# Patient Record
Sex: Female | Born: 1995 | Hispanic: No | Marital: Single | Smoking: Never smoker
Health system: Southern US, Community
[De-identification: ages and names within clinical notes are randomized; demographics above are authoritative.]

---
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
## 2016-11-29 IMAGING — CR DG CHEST 1V
1 series · 1 of 1 positions shown · non-contrast
Comparison: None.

CLINICAL DATA: Positive QuantiFERON Gold.  No symptoms.

EXAM:
CHEST  1 VIEW

[w chest pa]
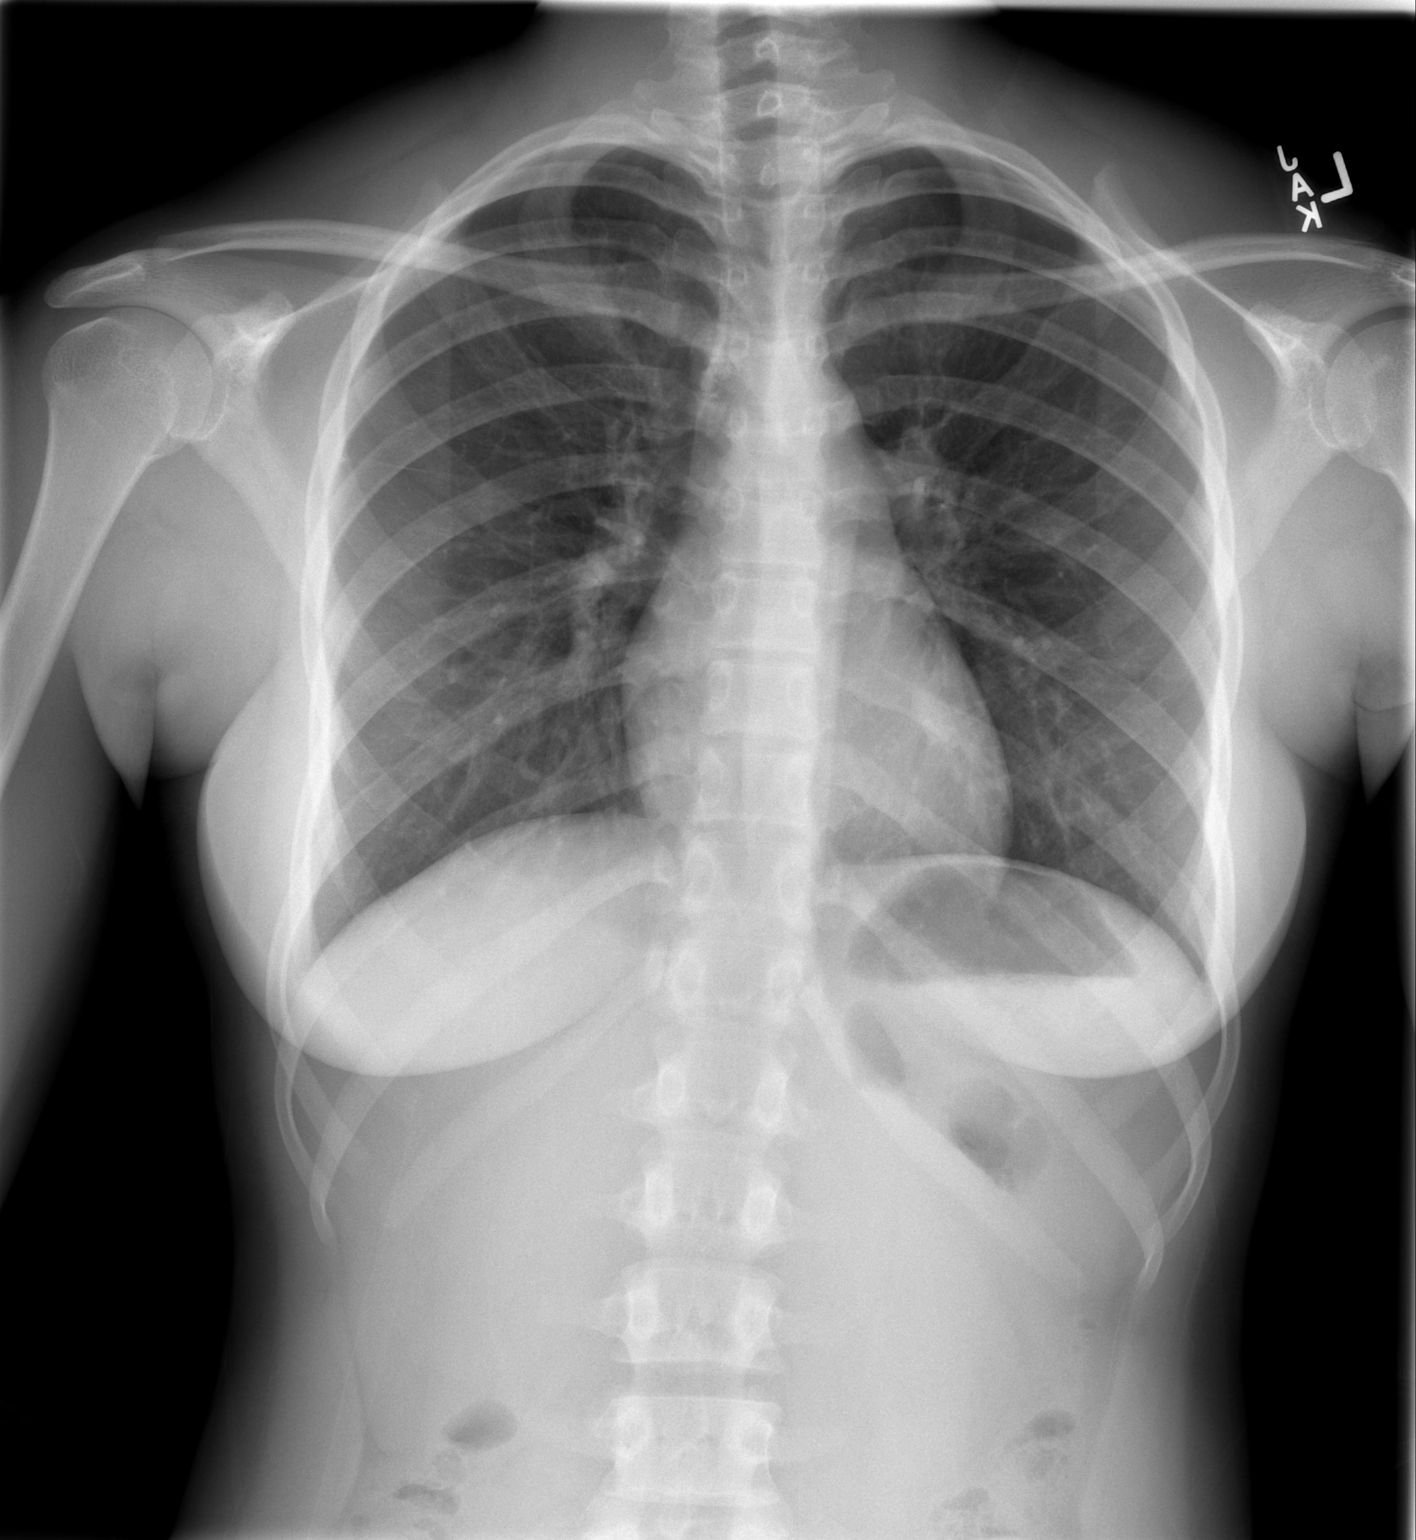

[1 of 1 positions shown; findings below may reference images not displayed]

FINDINGS: Normal heart size and mediastinal contours. No acute infiltrate or
edema. No scarring or calcified nodule. No effusion or pneumothorax.
No acute osseous findings.
IMPRESSION: Normal chest.

## 2018-03-22 ENCOUNTER — Ambulatory Visit (HOSPITAL_COMMUNITY)
Admission: EM | Admit: 2018-03-22 | Discharge: 2018-03-22 | Disposition: A | Discharge status: Home / Self Care | Payer: Self-pay | Attending: Family Medicine | Admitting: Family Medicine

## 2018-03-22 ENCOUNTER — Encounter (HOSPITAL_COMMUNITY): Payer: Self-pay | Admitting: Emergency Medicine

## 2018-03-22 DIAGNOSIS — L309 Dermatitis, unspecified: Secondary | ICD-10-CM

## 2018-03-22 MED ORDER — PREDNISONE 50 MG PO TABS: 50.0000 mg | ORAL_TABLET | Freq: Every day | ORAL | 0 refills | Status: DC

## 2018-03-22 MED ORDER — TRIAMCINOLONE ACETONIDE 0.1 % EX CREA: 1.0000 "application " | TOPICAL_CREAM | Freq: Two times a day (BID) | CUTANEOUS | 0 refills | Status: DC

## 2018-03-22 NOTE — ED Triage Notes (Signed)
Pt c/o rash on her R wrist x2 weeks, c/o itchiness.

## 2018-03-22 NOTE — Discharge Instructions (Signed)
Start prednisone as directed. Triamcinolone cream on affected area as directed. Avoid scratching, you can take over the counter allergy medicine to help. Avoid hot water.

## 2018-03-22 NOTE — ED Provider Notes (Signed)
[facility]    CSN: [id number] Arrival date & time: 03/22/18  1012     History   Chief Complaint Chief Complaint  Patient presents with  . Rash    HPI Becky Pugh is a 23 y.o. female.   23 year old female comes in for 2 week history of itching rash to the right wrist/hand and left elbow. States rash has been spreading gradually. Denies pain. Denies spreading erythema, warmth, fever. No obvious new contact/exposures. She does state she needs to wear thick gloves at work that can cause increased itching to the hand. Has not tried anything for the symptoms.      History reviewed. No pertinent past medical history.  Patient Active Problem List   Diagnosis Date Noted  . Abnormal finding on ultrasound 09/30/2014  . Abnormal ultrasound of kidney 09/30/2014  . 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 Hematuria 05/26/2014  . Need for vaccination 05/26/2014  . Vitamin D deficiency 04/06/2014  . Borderline high cholesterol 04/06/2014  . 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 Seasonal allergies 03/16/2014  . Eczema 03/16/2014  . Blurry vision 03/16/2014    History reviewed. No pertinent surgical history.  OB History   No obstetric history on file.      Home Medications    Prior to Admission medications   Medication Sig Start Date End Date Taking? Authorizing Provider  cetirizine (ZYRTEC) 10 MG tablet Take 1 tablet (10 mg total) by mouth daily. Patient not taking: Reported on 03/22/2018 03/16/14   Rockney Ghee, MD  cholecalciferol (VITAMIN D) 1000 UNITS tablet Take 1,000 Units by mouth daily.    [provider]  cromolyn (OPTICROM) 4 % ophthalmic solution Administer 1 drop to both eyes Two (2) times a day. 06/18/14   [provider]  ferrous sulfate 325 (65 FE) MG tablet Take 1 tablet (325 mg total) by mouth 2 (two) times daily with a meal. Patient not taking:  Reported on 03/22/2018 10/20/14   Gregor Hams, NP  fluticasone (FLONASE) 50 MCG/ACT nasal spray Place 2 sprays into both nostrils daily. 1 spray in each nostril every day Patient not taking: Reported on 03/22/2018 03/16/14   Rockney Ghee, MD  isoniazid (NYDRAZID) 100 MG tablet Take 200 mg by mouth.    [provider]  Olopatadine HCl (PATADAY) 0.2 % SOLN Administer 1 drop to both eyes daily as needed. 06/18/14   [provider]  permethrin (ACTICIN) 5 % cream Apply 1 application topically once. Apply head to toe. Apply to the entire family at the same time. Patient not taking: Reported on 03/22/2018 06/07/15   Gwenith Daily, MD  predniSONE (DELTASONE) 50 MG tablet Take 1 tablet (50 mg total) by mouth daily. 03/22/18   Cathie Hoops, Arnold Depinto V, PA-C  pyridoxine (B-6) 100 MG tablet Take 100 mg by mouth.    [provider]  triamcinolone cream (KENALOG) 0.1 % Apply 1 application topically 2 (two) times daily. 03/22/18   Belinda Fisher, PA-C    Family History Family History  Problem Relation Age of Onset  . Healthy Mother   . Healthy Father     Social History Social History   Tobacco Use  . Smoking status: Never Smoker  Substance Use Topics  . Alcohol use: Never    Alcohol/week: 0.0 standard drinks    Frequency: Never  . Drug use: Never     Allergies   Patient has no known  allergies.   Review of Systems Review of Systems  Reason unable to perform ROS: See HPI as above.     Physical Exam Triage Vital Signs ED Triage Vitals  Enc Vitals Group     BP 03/22/18 1041 125/85     Pulse Rate 03/22/18 1041 60     Resp 03/22/18 1041 16     Temp 03/22/18 1041 97.8 F (36.6 C)     Temp src --      SpO2 03/22/18 1041 100 %     Weight --      Height --      Head Circumference --      Peak Flow --      Pain Score 03/22/18 1042 0     Pain Loc --      Pain Edu? --      Excl. in [place]? --    No data found.  Updated Vital Signs BP 125/85   Pulse 60   Temp  97.8 F (36.6 C)   Resp 16   LMP 03/05/2018   SpO2 100%    Physical Exam Constitutional:      General: She is not in acute distress.    Appearance: She is well-developed. She is not diaphoretic.  HENT:     Head: Normocephalic and atraumatic.  Eyes:     Conjunctiva/sclera: Conjunctivae normal.     Pupils: Pupils are equal, round, and reactive to light.  Skin:    General: Skin is warm and dry.     Comments: Skin thickening to the right dorsal hand to the radial aspect with hyperpigmentation. No central clearing. Similar rash to the flexural ulnar area of the left elbow. No tenderness to palpation.   Neurological:     Mental Status: She is alert and oriented to person, place, and time.      UC Treatments / Results  Labs (all labs ordered are listed, but only abnormal results are displayed) Labs Reviewed - No data to display  EKG None  Radiology No results found.  Procedures Procedures (including critical care time)  Medications Ordered in UC Medications - No data to display  Initial Impression / Assessment and Plan / UC Course  I have reviewed the triage vital signs and the nursing notes.  Pertinent labs & imaging results that were available during my care of the patient were reviewed by me and considered in my medical decision making (see chart for details).    Exam consistent with eczema. Will start prednisone as directed. Triamcinolone cream as directed. Other symptomatic treatment discussed. Return precautions given. Patient expresses understanding and agrees to plan.  Final Clinical Impressions(s) / UC Diagnoses   Final diagnoses:  Eczema, unspecified type    ED Prescriptions    Medication Sig Dispense Auth. Provider   predniSONE (DELTASONE) 50 MG tablet Take 1 tablet (50 mg total) by mouth daily. 5 tablet Becky Gage V, PA-C   triamcinolone cream (KENALOG) 0.1 % Apply 1 application topically 2 (two) times daily. 45 g Threasa Alpha,  [place] 03/22/18 1125

## 2019-03-02 ENCOUNTER — Ambulatory Visit (HOSPITAL_COMMUNITY)
Admission: EM | Admit: 2019-03-02 | Discharge: 2019-03-02 | Disposition: A | Discharge status: Home / Self Care | Payer: Medicaid Other | Attending: Family Medicine | Admitting: Family Medicine

## 2019-03-02 ENCOUNTER — Other Ambulatory Visit: Payer: Self-pay

## 2019-03-02 ENCOUNTER — Encounter (HOSPITAL_COMMUNITY): Payer: Self-pay | Admitting: Emergency Medicine

## 2019-03-02 DIAGNOSIS — R519 Headache, unspecified: Secondary | ICD-10-CM

## 2019-03-02 MED ORDER — NAPROXEN 500 MG PO TABS: 500.0000 mg | ORAL_TABLET | Freq: Two times a day (BID) | ORAL | 0 refills | Status: AC

## 2019-03-02 NOTE — ED Triage Notes (Signed)
Pt states she has been suffering from headaches for the last three days.  Pt also reports over the last six months her periods have been lasting for a month at a time.

## 2019-03-02 NOTE — ED Provider Notes (Signed)
[facility]    CSN: [id number] Arrival date & time: 03/02/19  1533      History   Chief Complaint Chief Complaint  Patient presents with  . Headache  . Menstrual Problem    HPI Becky Pugh is a 24 y.o. female.   HPI  Headache for 3 days.  No visual symptoms or photophobia.  No nausea or change in appetite.  No head injury.  No sinus drainage or pressure.  No recent fever or chills, body aches.  No exposure to COVID-19.  She has taken over-the-counter Tylenol.  She states that this is not helped. She also states that she has had problems with her menstrual periods for several months.  This happened ever since she took a plan B.  Discussed with her that chronic medical problems need to be cared for by her gynecologist or family doctor.  She is not having any menstruation currently.  History reviewed. No pertinent past medical history.  Patient Active Problem List   Diagnosis Date Noted  . 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 Need for vaccination 05/26/2014  . Vitamin D deficiency 04/06/2014  . Borderline high cholesterol 04/06/2014  . 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 Seasonal allergies 03/16/2014  . Eczema 03/16/2014    History reviewed. No pertinent surgical history.  OB History   No obstetric history on file.      Home Medications    Prior to Admission medications   Medication Sig Start Date End Date Taking? Authorizing Provider  naproxen (NAPROSYN) 500 MG tablet Take 1 tablet (500 mg total) by mouth 2 (two) times daily. 03/02/19   Eustace Moore, MD  cetirizine (ZYRTEC) 10 MG tablet Take 1 tablet (10 mg total) by mouth daily. Patient not taking: Reported on 03/22/2018 03/16/14 03/02/19  Rockney Ghee, MD  ferrous sulfate 325 (65 FE) MG tablet Take 1 tablet (325 mg total) by mouth 2 (two) times daily with a meal. Patient not taking: Reported on 03/22/2018 10/20/14 03/02/19  Gregor Hams, NP   fluticasone (FLONASE) 50 MCG/ACT nasal spray Place 2 sprays into both nostrils daily. 1 spray in each nostril every day Patient not taking: Reported on 03/22/2018 03/16/14 03/02/19  Rockney Ghee, MD    Family History Family History  Problem Relation Age of Onset  . Healthy Mother   . Healthy Father     Social History Social History   Tobacco Use  . Smoking status: Never Smoker  . Smokeless tobacco: Never Used  Substance Use Topics  . Alcohol use: Never    Alcohol/week: 0.0 standard drinks  . Drug use: Never     Allergies   Patient has no known allergies.   Review of Systems Review of Systems  Eyes: Negative for photophobia and visual disturbance.  Gastrointestinal: Negative for nausea and vomiting.  Genitourinary: Positive for menstrual problem.  Neurological: Positive for headaches.     Physical Exam Triage Vital Signs ED Triage Vitals  Enc Vitals Group     BP 03/02/19 1634 108/73     Pulse Rate 03/02/19 1634 74     Resp 03/02/19 1634 12     Temp 03/02/19 1634 99.2 F (37.3 C)     Temp Source 03/02/19 1634 Oral     SpO2 03/02/19 1634 100 %     Weight --      Height --      Head Circumference --      Peak Flow --  Pain Score 03/02/19 1631 6     Pain Loc --      Pain Edu? --      Excl. in [place]? --    No data found.  Updated Vital Signs BP 108/73 (BP Location: Right Arm)   Pulse 74   Temp 99.2 F (37.3 C) (Oral)   Resp 12   LMP 02/03/2019 (Exact Date)   SpO2 100%      Physical Exam Constitutional:      General: She is not in acute distress.    Appearance: She is well-developed and normal weight.  HENT:     Head: Normocephalic and atraumatic.     Mouth/Throat:     Comments: Mask is in place Eyes:     Extraocular Movements: Extraocular movements intact.     Conjunctiva/sclera: Conjunctivae normal.     Pupils: Pupils are equal, round, and reactive to light.     Comments: Eyes appear normal  Cardiovascular:     Rate and Rhythm: Normal  rate.  Pulmonary:     Effort: Pulmonary effort is normal. No respiratory distress.  Musculoskeletal:        General: Normal range of motion.     Cervical back: Normal range of motion.  Skin:    General: Skin is warm and dry.  Neurological:     Mental Status: She is alert.  Psychiatric:        Mood and Affect: Mood normal.        Behavior: Behavior normal.     Comments: Normal mood and behavior.  Normal gait      UC Treatments / Results  Labs (all labs ordered are listed, but only abnormal results are displayed) Labs Reviewed - No data to display  EKG   Radiology No results found.  Procedures Procedures (including critical care time)  Medications Ordered in UC Medications - No data to display  Initial Impression / Assessment and Plan / UC Course  I have reviewed the triage vital signs and the nursing notes.  Pertinent labs & imaging results that were available during my care of the patient were reviewed by me and considered in my medical decision making (see chart for details).      Final Clinical Impressions(s) / UC Diagnoses   Final diagnoses:  Acute intractable headache, unspecified headache type     Discharge Instructions     Take 2 doses a day with food Take as needed headache  Call womens health about the menstrual problems    ED Prescriptions    Medication Sig Dispense Auth. Provider   naproxen (NAPROSYN) 500 MG tablet Take 1 tablet (500 mg total) by mouth 2 (two) times daily. 30 tablet Raylene Everts, MD     I have reviewed the PDMP during this encounter.   Raylene Everts, MD 03/02/19 [phone]

## 2019-03-02 NOTE — Discharge Instructions (Signed)
Take 2 doses a day with food Take as needed headache  Call womens health about the menstrual problems

## 2019-04-03 ENCOUNTER — Other Ambulatory Visit (HOSPITAL_COMMUNITY)
Admission: RE | Admit: 2019-04-03 | Discharge: 2019-04-03 | Disposition: A | Discharge status: Home / Self Care | Payer: Medicaid Other | Source: Ambulatory Visit | Attending: Nurse Practitioner | Admitting: Nurse Practitioner

## 2019-04-03 ENCOUNTER — Ambulatory Visit (INDEPENDENT_AMBULATORY_CARE_PROVIDER_SITE_OTHER): Payer: Self-pay | Admitting: Nurse Practitioner

## 2019-04-03 ENCOUNTER — Other Ambulatory Visit: Payer: Self-pay

## 2019-04-03 ENCOUNTER — Encounter: Payer: Self-pay | Admitting: Nurse Practitioner

## 2019-04-03 VITALS — BP 119/74 | HR 90 | Wt 145.9 lb

## 2019-04-03 DIAGNOSIS — N939 Abnormal uterine and vaginal bleeding, unspecified: Secondary | ICD-10-CM | POA: Insufficient documentation

## 2019-04-03 LAB — POCT PREGNANCY, URINE: Preg Test, Ur: NEGATIVE

## 2019-04-03 MED ORDER — NORGESTIMATE-ETH ESTRADIOL 0.25-35 MG-MCG PO TABS: 1.0000 | ORAL_TABLET | Freq: Every day | ORAL | 2 refills | Status: DC

## 2019-04-03 NOTE — Progress Notes (Signed)
   GYNECOLOGY OFFICE VISIT NOTE   History:  24 y.o. No obstetric history on file. here today for extended periods of vaginal bleeding.  Her bleeding was once monthly for 6 days but now bleeds for up to 6-8 weeks with one week off since August.  She is having vaginal bleeding today and does not want to have a pelvic exam.  She uses condoms for contraception and does not want hormonal birth control.  She denies any abnormal vaginal discharge, bleeding, pelvic pain or other concerns.   No past medical history on file.  No past surgical history on file.  The following portions of the patient's history were reviewed and updated as appropriate: allergies, current medications, past family history, past medical history, past social history, past surgical history and problem list.   Health Maintenance:  Has never had a pap smear or pelvic exam.  Review of Systems:  Pertinent items noted in HPI and remainder of comprehensive ROS otherwise negative.  Objective:  Physical Exam BP 119/74   Pulse 90   Wt 145 lb 14.4 oz (66.2 kg)   LMP 01/22/2019  CONSTITUTIONAL: Well-developed, well-nourished female in no acute distress.  HENT:  Normocephalic, atraumatic. External right and left ear normal.  EYES: Conjunctivae and EOM are normal. Pupils are equal, round.  No scleral icterus.  NECK: Normal range of motion, supple SKIN: Skin is warm and dry. No rash noted. Not diaphoretic. No erythema. No pallor. NEUROLOGIC: Alert and oriented to person, place, and time. Normal muscle tone coordination. No cranial nerve deficit noted. PSYCHIATRIC: Normal mood and affect. Normal behavior. Normal judgment and thought content. CARDIOVASCULAR: Normal heart rate noted RESPIRATORY: Effort and breath sounds normal, no problems with respiration noted PELVIC: Deferred MUSCULOSKELETAL: Normal range of motion. No edema noted.  Labs and Imaging No results found.  Assessment & Plan:  1. Abnormal vaginal bleeding Advised  the first step is to check for any infections.  Bleeding started today and client does not want to have pelvic today. Urine Pregnancy test is negative. Prescribed birth control pills to manage extended bleeding.  To start on Sunday and advised to take daily.  Advised to continue using condoms.  - GC/Chlamydia probe amp ([place])not at [facility] - done on urine Blood work not done as lab not available at this time.  Routine preventative health maintenance measures emphasized. Please refer to After Visit Summary for other counseling recommendations.   Return in about 6 weeks (around 05/15/2019) for Annual exam with pap - first pelvic.   Total face-to-face time with patient: 10 minutes.  Over 50% of encounter was spent on counseling and coordination of care.  Nolene Bernheim, RN, MSN, NP-BC Nurse Practitioner, [facility] for [employer], [facility] 04/03/2019 12:11 PM

## 2019-04-06 LAB — GC/CHLAMYDIA PROBE AMP (~~LOC~~) NOT AT ARMC
Chlamydia: NEGATIVE
Comment: NEGATIVE
Comment: NORMAL
Neisseria Gonorrhea: NEGATIVE

## 2019-05-19 ENCOUNTER — Encounter: Payer: Self-pay | Admitting: Nurse Practitioner

## 2019-05-19 ENCOUNTER — Other Ambulatory Visit: Payer: Self-pay

## 2019-05-19 ENCOUNTER — Other Ambulatory Visit (HOSPITAL_COMMUNITY)
Admission: RE | Admit: 2019-05-19 | Discharge: 2019-05-19 | Disposition: A | Discharge status: Home / Self Care | Payer: Medicaid Other | Source: Ambulatory Visit | Attending: Nurse Practitioner | Admitting: Nurse Practitioner

## 2019-05-19 ENCOUNTER — Ambulatory Visit (INDEPENDENT_AMBULATORY_CARE_PROVIDER_SITE_OTHER): Payer: Self-pay | Admitting: Nurse Practitioner

## 2019-05-19 VITALS — BP 114/78 | HR 74 | Ht 64.0 in | Wt 141.2 lb

## 2019-05-19 DIAGNOSIS — Z113 Encounter for screening for infections with a predominantly sexual mode of transmission: Secondary | ICD-10-CM | POA: Insufficient documentation

## 2019-05-19 DIAGNOSIS — Z3041 Encounter for surveillance of contraceptive pills: Secondary | ICD-10-CM

## 2019-05-19 DIAGNOSIS — Z01419 Encounter for gynecological examination (general) (routine) without abnormal findings: Secondary | ICD-10-CM | POA: Diagnosis not present

## 2019-05-19 DIAGNOSIS — Z793 Long term (current) use of hormonal contraceptives: Secondary | ICD-10-CM

## 2019-05-19 MED ORDER — FLUCONAZOLE 150 MG PO TABS: 150.0000 mg | ORAL_TABLET | Freq: Once | ORAL | 0 refills | Status: AC

## 2019-05-19 NOTE — Progress Notes (Signed)
GYNECOLOGY ANNUAL PREVENTATIVE CARE ENCOUNTER NOTE  Subjective:   Becky Pugh is a 24 y.o. female here for a routine annual gynecologic exam.  She was here recently and started on combined contraceptive pills to stop her very long cycles.  Current complaints: She has had lots of menstrul cramping that was worse with any cramping she has had before.  Using Tylenol or Midol for the pain.   Is have external genital itching.  Denies abnormal vaginal bleeding, discharge, problems with intercourse or other gynecologic concerns.    Gynecologic History No LMP recorded. (Menstrual status: Oral contraceptives). Contraception: OCP (estrogen/progesterone) Last Pap: None   Obstetric History OB History  No obstetric history on file.    No past medical history on file.  No past surgical history on file.  Current Outpatient Medications on File Prior to Visit  Medication Sig Dispense Refill  . norgestimate-ethinyl estradiol (ORTHO-CYCLEN) 0.25-35 MG-MCG tablet Take 1 tablet by mouth daily. 1 Package 2  . naproxen (NAPROSYN) 500 MG tablet Take 1 tablet (500 mg total) by mouth 2 (two) times daily. (Patient not taking: Reported on 04/03/2019) 30 tablet 0  . [DISCONTINUED] cetirizine (ZYRTEC) 10 MG tablet Take 1 tablet (10 mg total) by mouth daily. (Patient not taking: Reported on 03/22/2018) 30 tablet 2  . [DISCONTINUED] ferrous sulfate 325 (65 FE) MG tablet Take 1 tablet (325 mg total) by mouth 2 (two) times daily with a meal. (Patient not taking: Reported on 03/22/2018) 60 tablet 3  . [DISCONTINUED] fluticasone (FLONASE) 50 MCG/ACT nasal spray Place 2 sprays into both nostrils daily. 1 spray in each nostril every day (Patient not taking: Reported on 03/22/2018) 16 g 12   No current facility-administered medications on file prior to visit.    No Known Allergies  Social History   Socioeconomic History  . Marital status: Single    Spouse name: Not on file  . Number of children: Not on file  . Years  of education: Not on file  . Highest education level: Not on file  Occupational History  . Not on file  Tobacco Use  . Smoking status: Never Smoker  . Smokeless tobacco: Never Used  Substance and Sexual Activity  . Alcohol use: Never    Alcohol/week: 0.0 standard drinks  . Drug use: Never  . Sexual activity: Not on file  Other Topics Concern  . Not on file  Social History Narrative  . Not on file   Social Determinants of Health   Financial Resource Strain:   . Difficulty of Paying Living Expenses:   Food Insecurity:   . Worried About Programme researcher, broadcasting/film/video in the Last Year:   . Barista in the Last Year:   Transportation Needs:   . Freight forwarder (Medical):   Marland Kitchen Lack of Transportation (Non-Medical):   Physical Activity:   . Days of Exercise per Week:   . Minutes of Exercise per Session:   Stress:   . Feeling of Stress :   Social Connections:   . Frequency of Communication with Friends and Family:   . Frequency of Social Gatherings with Friends and Family:   . Attends Religious Services:   . Active Member of Clubs or Organizations:   . Attends Banker Meetings:   Marland Kitchen Marital Status:   Intimate Partner Violence:   . Fear of Current or Ex-Partner:   . Emotionally Abused:   Marland Kitchen Physically Abused:   . Sexually Abused:  Family History  Problem Relation Age of Onset  . Healthy Mother   . Healthy Father     The following portions of the patient's history were reviewed and updated as appropriate: allergies, current medications, past family history, past medical history, past social history, past surgical history and problem list.  Review of Systems Pertinent items noted in HPI and remainder of comprehensive ROS otherwise negative.   Objective:  BP 114/78   Pulse 74   Ht 5\' 4"  (1.626 m)   Wt 141 lb 3.2 oz (64 kg)   BMI 24.24 kg/m  CONSTITUTIONAL: Well-developed, well-nourished female in no acute distress.  HENT:  Normocephalic, atraumatic,  External right and left ear normal.  EYES: Conjunctivae and EOM are normal. Pupils are equal, round.  No scleral icterus.  NECK: Normal range of motion, supple, no masses.  Normal thyroid.  SKIN: Skin is warm and dry. No rash noted. Not diaphoretic. No erythema. No pallor. NEUROLOGIC: Alert and oriented to person, place, and time. Normal reflexes, muscle tone coordination. No cranial nerve deficit noted. PSYCHIATRIC: Normal mood and affect. Normal behavior. Normal judgment and thought content. CARDIOVASCULAR: Normal heart rate noted, regular rhythm RESPIRATORY: Clear to auscultation bilaterally. Effort and breath sounds normal, no problems with respiration noted. BREASTS: Symmetric in size. No masses, skin changes, nipple drainage, or lymphadenopathy. ABDOMEN: Soft, no distention noted.  No tenderness, rebound or guarding.  PELVIC: First pelvic.  Eexternal genitalia have areas of hypopigmentation; normal appearing vaginal mucosa and cervix, but some clumps of adherent discharge noted.  No abnormal discharge noted.  Pap smear obtained.  Normal uterine size, no other palpable masses, no uterine or adnexal tenderness. MUSCULOSKELETAL: Normal range of motion. No tenderness.  No cyanosis, clubbing, or edema.    Assessment and Plan:  1. Encounter for annual routine gynecological examination  - Cytology - PAP( [place])  2. Screen for STD (sexually transmitted disease)  - Cervicovaginal ancillary only( [place]) - HIV Antibody (routine testing w rflx) - RPR - Hepatitis C Antibody - Hepatitis B Surface AntiGEN  3. Encounter for surveillance of contraceptive pills Will continue pills one more cycle and see if there is pain in menses.  If so, client was instructed to call the clinic or send a message and a different pill can be prescribed.  Pills have regulated her cycle but she is experiencing lots of unexpected cramping with menses.  4.  Itching Will treat for yeast that clinically is  present  Will follow up results of pap smear and manage accordingly. Routine preventative health maintenance measures emphasized. Please refer to After Visit Summary for other counseling recommendations.    Earlie Server, RN, MSN, NP-BC Nurse Practitioner, [place] for [facility]

## 2019-05-19 NOTE — Patient Instructions (Signed)
IBUPROFEN 600 mg by mouth every 6 hours.  Take with food.

## 2019-05-20 LAB — RPR: RPR Ser Ql: NONREACTIVE

## 2019-05-20 LAB — CERVICOVAGINAL ANCILLARY ONLY
Bacterial Vaginitis (gardnerella): POSITIVE — AB
Candida Glabrata: NEGATIVE
Candida Vaginitis: POSITIVE — AB
Comment: NEGATIVE
Comment: NEGATIVE
Comment: NEGATIVE
Comment: NEGATIVE
Trichomonas: NEGATIVE

## 2019-05-20 LAB — HEPATITIS B SURFACE ANTIGEN: Hepatitis B Surface Ag: NEGATIVE

## 2019-05-20 LAB — HEPATITIS C ANTIBODY: Hep C Virus Ab: <0.1 s/co ratio (ref 0.0–0.9)

## 2019-05-20 LAB — HIV ANTIBODY (ROUTINE TESTING W REFLEX): HIV Screen 4th Generation wRfx: NONREACTIVE

## 2019-05-22 ENCOUNTER — Encounter: Payer: Self-pay | Admitting: Nurse Practitioner

## 2019-05-22 DIAGNOSIS — R87610 Atypical squamous cells of undetermined significance on cytologic smear of cervix (ASC-US): Secondary | ICD-10-CM | POA: Insufficient documentation

## 2019-05-22 LAB — CYTOLOGY - PAP
Chlamydia: NEGATIVE
Comment: NEGATIVE
Comment: NEGATIVE
Comment: NORMAL
Diagnosis: UNDETERMINED — AB
High risk HPV: POSITIVE — AB
Neisseria Gonorrhea: NEGATIVE

## 2019-06-29 ENCOUNTER — Ambulatory Visit: Payer: Medicaid Other | Admitting: Nurse Practitioner

## 2020-02-25 ENCOUNTER — Other Ambulatory Visit: Payer: Self-pay

## 2020-02-25 ENCOUNTER — Encounter (HOSPITAL_COMMUNITY): Payer: Self-pay

## 2020-02-25 ENCOUNTER — Ambulatory Visit (HOSPITAL_COMMUNITY)
Admission: EM | Admit: 2020-02-25 | Discharge: 2020-02-25 | Disposition: A | Discharge status: Home / Self Care | Payer: BC Managed Care – PPO | Attending: Emergency Medicine | Admitting: Emergency Medicine

## 2020-02-25 DIAGNOSIS — R21 Rash and other nonspecific skin eruption: Secondary | ICD-10-CM | POA: Diagnosis not present

## 2020-02-25 MED ORDER — TRIAMCINOLONE ACETONIDE 0.1 % EX CREA: 1.0000 "application " | TOPICAL_CREAM | Freq: Two times a day (BID) | CUTANEOUS | 0 refills | Status: DC

## 2020-02-25 NOTE — ED Provider Notes (Signed)
[facility]    CSN: [id number] Arrival date & time: 02/25/20  1802      History   Chief Complaint Chief Complaint  Patient presents with  . Hand Problem    HPI Becky Pugh is a 25 y.o. female.   Patient presents with a rash on her right palm at the base of her thumb x1 week.  She attributes this to new gloves at her work.  She states the rash is pruritic and nontender.  She denies fever, chills, numbness, weakness, paresthesias, or other symptoms.  No treatments attempted at home.  Her medical history includes eczema and seasonal allergies.  The history is provided by the patient and medical records.    History reviewed. No pertinent past medical history.  Patient Active Problem List   Diagnosis Date Noted  . Pap smear abnormality of cervix with ASCUS favoring benign 05/22/2019  . 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 Need for vaccination 05/26/2014  . Vitamin D deficiency 04/06/2014  . Borderline high cholesterol 04/06/2014  . 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 Seasonal allergies 03/16/2014  . Eczema 03/16/2014    History reviewed. No pertinent surgical history.  OB History   No obstetric history on file.      Home Medications    Prior to Admission medications   Medication Sig Start Date End Date Taking? Authorizing Provider  triamcinolone (KENALOG) 0.1 % Apply 1 application topically 2 (two) times daily. 02/25/20  Yes Mickie Bail, NP  naproxen (NAPROSYN) 500 MG tablet Take 1 tablet (500 mg total) by mouth 2 (two) times daily. Patient not taking: Reported on 04/03/2019 03/02/19   Eustace Moore, MD  norgestimate-ethinyl estradiol (ORTHO-CYCLEN) 0.25-35 MG-MCG tablet Take 1 tablet by mouth daily. 04/03/19   Burleson, Brand Males, NP  cetirizine (ZYRTEC) 10 MG tablet Take 1 tablet (10 mg total) by mouth daily. Patient not taking: Reported on 03/22/2018 03/16/14 03/02/19  Rockney Ghee, MD  ferrous  sulfate 325 (65 FE) MG tablet Take 1 tablet (325 mg total) by mouth 2 (two) times daily with a meal. Patient not taking: Reported on 03/22/2018 10/20/14 03/02/19  Gregor Hams, NP  fluticasone (FLONASE) 50 MCG/ACT nasal spray Place 2 sprays into both nostrils daily. 1 spray in each nostril every day Patient not taking: Reported on 03/22/2018 03/16/14 03/02/19  Rockney Ghee, MD    Family History Family History  Problem Relation Age of Onset  . Healthy Mother   . Healthy Father     Social History Social History   Tobacco Use  . Smoking status: Never Smoker  . Smokeless tobacco: Never Used  Vaping Use  . Vaping Use: Never used  Substance Use Topics  . Alcohol use: Never    Alcohol/week: 0.0 standard drinks  . Drug use: Never     Allergies   Patient has no known allergies.   Review of Systems Review of Systems  Constitutional: Negative for chills and fever.  HENT: Negative for ear pain and sore throat.   Eyes: Negative for pain and visual disturbance.  Respiratory: Negative for cough and shortness of breath.   Cardiovascular: Negative for chest pain and palpitations.  Gastrointestinal: Negative for abdominal pain and vomiting.  Genitourinary: Negative for dysuria and hematuria.  Musculoskeletal: Negative for arthralgias and back pain.  Skin: Positive for rash. Negative for color change.  Neurological: Negative for syncope, weakness and numbness.  All other systems reviewed and are negative.  Physical Exam Triage Vital Signs ED Triage Vitals  Enc Vitals Group     BP      Pulse      Resp      Temp      Temp src      SpO2      Weight      Height      Head Circumference      Peak Flow      Pain Score      Pain Loc      Pain Edu?      Excl. in GC?    No data found.  Updated Vital Signs BP 128/74 (BP Location: Left Arm)   Pulse 60   Temp 98.3 F (36.8 C)   Resp 18   LMP 01/28/2020 (Approximate)   SpO2 100%   Visual Acuity Right Eye Distance:    Left Eye Distance:   Bilateral Distance:    Right Eye Near:   Left Eye Near:    Bilateral Near:     Physical Exam Vitals and nursing note reviewed.  Constitutional:      General: She is not in acute distress.    Appearance: She is well-developed and well-nourished. She is not ill-appearing.  HENT:     Head: Normocephalic and atraumatic.     Mouth/Throat:     Mouth: Mucous membranes are moist.  Eyes:     Conjunctiva/sclera: Conjunctivae normal.  Cardiovascular:     Rate and Rhythm: Normal rate and regular rhythm.     Heart sounds: Normal heart sounds.  Pulmonary:     Effort: Pulmonary effort is normal. No respiratory distress.     Breath sounds: Normal breath sounds.  Abdominal:     Palpations: Abdomen is soft.     Tenderness: There is no abdominal tenderness.  Musculoskeletal:        General: No swelling, tenderness, deformity or edema. Normal range of motion.     Cervical back: Neck supple.  Skin:    General: Skin is warm and dry.     Capillary Refill: Capillary refill takes less than 2 seconds.     Findings: Rash present.     Comments: Flesh-colored vesicular rash on right palm at base of thumb.  No drainage or erythema.  Neurological:     General: No focal deficit present.     Mental Status: She is alert and oriented to person, place, and time.     Sensory: No sensory deficit.     Motor: No weakness.     Gait: Gait normal.  Psychiatric:        Mood and Affect: Mood and affect and mood normal.        Behavior: Behavior normal.      UC Treatments / Results  Labs (all labs ordered are listed, but only abnormal results are displayed) Labs Reviewed - No data to display  EKG   Radiology No results found.  Procedures Procedures (including critical care time)  Medications Ordered in UC Medications - No data to display  Initial Impression / Assessment and Plan / UC Course  I have reviewed the triage vital signs and the nursing notes.  Pertinent labs &  imaging results that were available during my care of the patient were reviewed by me and considered in my medical decision making (see chart for details).   Rash.  Treating with triamcinolone cream.  Discussed signs of infection and education provided on rashes.  Instructed patient to follow-up  with a PCP or return here for symptoms are not improving.  Assistance requested from [facility] health in obtaining a PCP.  Patient agrees to plan of care.   Final Clinical Impressions(s) / UC Diagnoses   Final diagnoses:  Rash     Discharge Instructions     Apply the cream as directed.    Follow up with your primary care provider if your symptoms are not improving.        ED Prescriptions    Medication Sig Dispense Auth. Provider   triamcinolone (KENALOG) 0.1 % Apply 1 application topically 2 (two) times daily. 30 g Mickie Bail, NP     PDMP not reviewed this encounter.   Mickie Bail, NP 02/25/20 [place]

## 2020-02-25 NOTE — ED Triage Notes (Signed)
Pt in with c/o right hand rash that she noticed earlier this week. Patient states the rash is itchy and she has minor pain when she moves her hand a certain way  Pt has not been taking medication for sxs

## 2020-02-25 NOTE — Discharge Instructions (Signed)
Apply the cream as directed.    Follow up with your primary care provider if your symptoms are not improving.

## 2020-03-01 ENCOUNTER — Encounter (HOSPITAL_COMMUNITY): Payer: Self-pay

## 2020-03-01 ENCOUNTER — Other Ambulatory Visit: Payer: Self-pay

## 2020-03-01 ENCOUNTER — Ambulatory Visit (HOSPITAL_COMMUNITY)
Admission: EM | Admit: 2020-03-01 | Discharge: 2020-03-01 | Disposition: A | Discharge status: Home / Self Care | Payer: BC Managed Care – PPO | Attending: Family Medicine | Admitting: Family Medicine

## 2020-03-01 DIAGNOSIS — L309 Dermatitis, unspecified: Secondary | ICD-10-CM | POA: Diagnosis not present

## 2020-03-01 MED ORDER — CEPHALEXIN 500 MG PO CAPS: 500.0000 mg | ORAL_CAPSULE | Freq: Two times a day (BID) | ORAL | 0 refills | Status: DC

## 2020-03-01 NOTE — ED Triage Notes (Signed)
Pt here for re-evaluation of wound/rash to right hand. Pt c/o pain, itching to blistered area and healing area to right thumb/palmar area. States she came into contact with something at work. Pt states Rx that she was given is not improving area.

## 2020-03-01 NOTE — Discharge Instructions (Signed)
Can also apply aquaphor ointment to help protect the skin

## 2020-03-05 NOTE — ED Provider Notes (Signed)
[facility]    CSN: [id number] Arrival date & time: 03/01/20  1922      History   Chief Complaint Chief Complaint  Patient presents with  . Wound Check    HPI Becky Pugh is a 25 y.o. female.   Here today following up on a rash to palmar aspect of right hand at base of thumb that has been present for over a week. Blistering, itching, tender, and now swollen and crusting. She thinks she may have come into contact with something at work that caused it but is not sure as this has never happened to her before. Was given triamcinolone cream at recent UC visit for this which she does not feel has been helping. Denies fever, chills, sweats, aches, bleeding, spreading. No known hx of dermatologic issues.      History reviewed. No pertinent past medical history.  Patient Active Problem List   Diagnosis Date Noted  . Pap smear abnormality of cervix with ASCUS favoring benign 05/22/2019  . 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 Need for vaccination 05/26/2014  . Vitamin D deficiency 04/06/2014  . Borderline high cholesterol 04/06/2014  . 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 Seasonal allergies 03/16/2014  . Eczema 03/16/2014    History reviewed. No pertinent surgical history.  OB History   No obstetric history on file.      Home Medications    Prior to Admission medications   Medication Sig Start Date End Date Taking? Authorizing Provider  cephALEXin (KEFLEX) 500 MG capsule Take 1 capsule (500 mg total) by mouth 2 (two) times daily. 03/01/20  Yes Particia Nearing, PA-C  norgestimate-ethinyl estradiol (ORTHO-CYCLEN) 0.25-35 MG-MCG tablet Take 1 tablet by mouth daily. 04/03/19  Yes Burleson, Terri L, NP  triamcinolone (KENALOG) 0.1 % Apply 1 application topically 2 (two) times daily. 02/25/20  Yes Mickie Bail, NP  naproxen (NAPROSYN) 500 MG tablet Take 1 tablet (500 mg total) by mouth 2 (two) times  daily. Patient not taking: Reported on 04/03/2019 03/02/19   Eustace Moore, MD  cetirizine (ZYRTEC) 10 MG tablet Take 1 tablet (10 mg total) by mouth daily. Patient not taking: Reported on 03/22/2018 03/16/14 03/02/19  Rockney Ghee, MD  ferrous sulfate 325 (65 FE) MG tablet Take 1 tablet (325 mg total) by mouth 2 (two) times daily with a meal. Patient not taking: Reported on 03/22/2018 10/20/14 03/02/19  Gregor Hams, NP  fluticasone (FLONASE) 50 MCG/ACT nasal spray Place 2 sprays into both nostrils daily. 1 spray in each nostril every day Patient not taking: Reported on 03/22/2018 03/16/14 03/02/19  Rockney Ghee, MD    Family History Family History  Problem Relation Age of Onset  . Healthy Mother   . Healthy Father     Social History Social History   Tobacco Use  . Smoking status: Never Smoker  . Smokeless tobacco: Never Used  Vaping Use  . Vaping Use: Never used  Substance Use Topics  . Alcohol use: Never    Alcohol/week: 0.0 standard drinks  . Drug use: Never     Allergies   Patient has no known allergies.   Review of Systems Review of Systems PER HPI    Physical Exam Triage Vital Signs ED Triage Vitals  Enc Vitals Group     BP 03/01/20 2004 136/87     Pulse Rate 03/01/20 2004 87     Resp 03/01/20 2004 18     Temp 03/01/20 2004  99.3 F (37.4 C)     Temp Source 03/01/20 2004 Oral     SpO2 03/01/20 2004 100 %     Weight --      Height --      Head Circumference --      Peak Flow --      Pain Score 03/01/20 2002 7     Pain Loc --      Pain Edu? --      Excl. in GC? --    No data found.  Updated Vital Signs BP 136/87 (BP Location: Left Arm)   Pulse 87   Temp 99.3 F (37.4 C) (Oral)   Resp 18   LMP 01/22/2020   SpO2 100%   Visual Acuity Right Eye Distance:   Left Eye Distance:   Bilateral Distance:    Right Eye Near:   Left Eye Near:    Bilateral Near:     Physical Exam Vitals and nursing note reviewed.  Constitutional:       Appearance: Normal appearance. She is not ill-appearing.  HENT:     Head: Atraumatic.  Eyes:     Extraocular Movements: Extraocular movements intact.     Conjunctiva/sclera: Conjunctivae normal.  Cardiovascular:     Rate and Rhythm: Normal rate and regular rhythm.     Heart sounds: Normal heart sounds.  Pulmonary:     Effort: Pulmonary effort is normal.     Breath sounds: Normal breath sounds.  Musculoskeletal:        General: Normal range of motion.     Cervical back: Normal range of motion and neck supple.  Skin:    General: Skin is warm.     Comments: Right palmar rash at base of thumb only with blisters, inflammatory appearing changes and edema. Several areas that appear to be filling with purulence though not actively draining. Entire area is about 1 cm x 3 cm  Neurological:     Mental Status: She is alert and oriented to person, place, and time.  Psychiatric:        Mood and Affect: Mood normal.        Thought Content: Thought content normal.        Judgment: Judgment normal.      UC Treatments / Results  Labs (all labs ordered are listed, but only abnormal results are displayed) Labs Reviewed - No data to display  EKG   Radiology No results found.  Procedures Procedures (including critical care time)  Medications Ordered in UC Medications - No data to display  Initial Impression / Assessment and Plan / UC Course  I have reviewed the triage vital signs and the nursing notes.  Pertinent labs & imaging results that were available during my care of the patient were reviewed by me and considered in my medical decision making (see chart for details).     Irritant reaction with possible secondary bacterial infection vs new onset ?dishydrotic eczema. Continue triamcinolone, start antihistamine, keflex to cover for possible infection, and follow up with Dermatology if not fully resolving. Work note given as she has to wear gloves which seem to irritate the area.   Final Clinical Impressions(s) / UC Diagnoses   Final diagnoses:  Hand dermatitis     Discharge Instructions     Can also apply aquaphor ointment to help protect the skin    ED Prescriptions    Medication Sig Dispense Auth. Provider   cephALEXin (KEFLEX) 500 MG capsule Take 1 capsule (500  mg total) by mouth 2 (two) times daily. 14 capsule Particia Nearing, [place]     PDMP not reviewed this encounter.   Particia Nearing, [place] 03/05/20 1120

## 2020-03-11 ENCOUNTER — Emergency Department (HOSPITAL_COMMUNITY): Payer: Worker's Compensation

## 2020-03-11 ENCOUNTER — Emergency Department (HOSPITAL_COMMUNITY)
Admission: EM | Admit: 2020-03-11 | Discharge: 2020-03-11 | Disposition: A | Discharge status: Home / Self Care | Payer: Worker's Compensation | Attending: Emergency Medicine | Admitting: Emergency Medicine

## 2020-03-11 ENCOUNTER — Other Ambulatory Visit: Payer: Self-pay

## 2020-03-11 DIAGNOSIS — S60031A Contusion of right middle finger without damage to nail, initial encounter: Secondary | ICD-10-CM | POA: Diagnosis not present

## 2020-03-11 DIAGNOSIS — Y990 Civilian activity done for income or pay: Secondary | ICD-10-CM | POA: Insufficient documentation

## 2020-03-11 DIAGNOSIS — W230XXA Caught, crushed, jammed, or pinched between moving objects, initial encounter: Secondary | ICD-10-CM | POA: Insufficient documentation

## 2020-03-11 DIAGNOSIS — S6991XA Unspecified injury of right wrist, hand and finger(s), initial encounter: Secondary | ICD-10-CM | POA: Diagnosis present

## 2020-03-11 MED ORDER — ACETAMINOPHEN 500 MG PO TABS
1000.0000 mg | ORAL_TABLET | Freq: Once | ORAL | Status: AC
  Administered 2020-03-11: 1000 mg via ORAL
  Filled 2020-03-11: qty 2

## 2020-03-11 NOTE — Discharge Instructions (Signed)
Wear the splint for the next 2-3 days.  Use ice and elevate.  You can take ibuprofen and tylenol as needed for pain.

## 2020-03-11 NOTE — ED Triage Notes (Signed)
Pt came in with c/o R hand injury. Pt was at work when her hand got pressed in a machine. Middle, index, and thumb are in most pain according to pt. Middle finger appears swollen. Pt rates pain 7/10

## 2020-03-11 NOTE — ED Provider Notes (Signed)
[facility]-EMERGENCY DEPT Provider Note   CSN: [id number] Arrival date & time: 03/11/20  2040     History Chief Complaint  Patient presents with  . Hand Injury    Becky Pugh is a 25 y.o. female.  The history is provided by the patient.  Hand Injury Location:  Finger Finger location:  R thumb, R index finger and R middle finger Injury: yes   Time since incident:  2 hours Mechanism of injury: crush   Crush injury:    Mechanism:  Office manager weight of object:  Unknown Pain details:    Quality:  Aching and throbbing   Radiates to:  Does not radiate   Severity:  Moderate   Onset quality:  Sudden   Timing:  Constant   Progression:  Unchanged Handedness:  Right-handed Foreign body present:  No foreign bodies Prior injury to area:  No Relieved by:  None tried Worsened by:  Movement Ineffective treatments:  None tried Associated symptoms: decreased range of motion and swelling        No past medical history on file.  Patient Active Problem List   Diagnosis Date Noted  . Pap smear abnormality of cervix with ASCUS favoring benign 05/22/2019  . 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 Need for vaccination 05/26/2014  . Vitamin D deficiency 04/06/2014  . Borderline high cholesterol 04/06/2014  . 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 Seasonal allergies 03/16/2014  . Eczema 03/16/2014    No past surgical history on file.   OB History   No obstetric history on file.     Family History  Problem Relation Age of Onset  . Healthy Mother   . Healthy Father     Social History   Tobacco Use  . Smoking status: Never Smoker  . Smokeless tobacco: Never Used  Vaping Use  . Vaping Use: Never used  Substance Use Topics  . Alcohol use: Never    Alcohol/week: 0.0 standard drinks  . Drug use: Never    Home Medications Prior to Admission medications   Medication Sig  Start Date End Date Taking? Authorizing Provider  cephALEXin (KEFLEX) 500 MG capsule Take 1 capsule (500 mg total) by mouth 2 (two) times daily. 03/01/20  Yes Particia Nearing, PA-C  naproxen (NAPROSYN) 500 MG tablet Take 1 tablet (500 mg total) by mouth 2 (two) times daily. Patient not taking: No sig reported 03/02/19   Eustace Moore, MD  norgestimate-ethinyl estradiol (ORTHO-CYCLEN) 0.25-35 MG-MCG tablet Take 1 tablet by mouth daily. Patient not taking: No sig reported 04/03/19   Nolene Bernheim L, NP  triamcinolone (KENALOG) 0.1 % Apply 1 application topically 2 (two) times daily. Patient not taking: Reported on 03/11/2020 02/25/20   Mickie Bail, NP  cetirizine (ZYRTEC) 10 MG tablet Take 1 tablet (10 mg total) by mouth daily. Patient not taking: Reported on 03/22/2018 03/16/14 03/02/19  Rockney Ghee, MD  ferrous sulfate 325 (65 FE) MG tablet Take 1 tablet (325 mg total) by mouth 2 (two) times daily with a meal. Patient not taking: Reported on 03/22/2018 10/20/14 03/02/19  Gregor Hams, NP  fluticasone (FLONASE) 50 MCG/ACT nasal spray Place 2 sprays into both nostrils daily. 1 spray in each nostril every day Patient not taking: Reported on 03/22/2018 03/16/14 03/02/19  Rockney Ghee, MD    Allergies    Patient has no known allergies.  Review of Systems   Review of Systems  All  other systems reviewed and are negative.   Physical Exam Updated Vital Signs BP 121/80 (BP Location: Left Arm)   Pulse 63   Temp 98.5 F (36.9 C) (Oral)   Resp 16   Ht 5\' 4"  (1.626 m)   Wt 63.5 kg   SpO2 100%   BMI 24.03 kg/m   Physical Exam Vitals and nursing note reviewed.  Constitutional:      General: She is not in acute distress.    Appearance: Normal appearance. She is normal weight.  HENT:     Head: Normocephalic.  Cardiovascular:     Rate and Rhythm: Normal rate.     Pulses: Normal pulses.  Pulmonary:     Effort: Pulmonary effort is normal.  Musculoskeletal:        General:  Tenderness and signs of injury present.     Right wrist: Normal.     Right hand: Swelling and tenderness present. Decreased range of motion.       Hands:  Skin:    General: Skin is warm and dry.  Neurological:     Mental Status: She is alert. Mental status is at baseline.  Psychiatric:        Mood and Affect: Mood normal.        Behavior: Behavior normal.        Thought Content: Thought content normal.      ED Results / Procedures / Treatments   Labs (all labs ordered are listed, but only abnormal results are displayed) Labs Reviewed - No data to display  EKG None  Radiology DG Wrist Complete Right  Result Date: 03/11/2020 CLINICAL DATA:  Right hand injury, hand caught in machinery, middle finger, index finger and thumb are most in pain. Middle finger appearing swollen. EXAM: RIGHT HAND - 2 VIEW; RIGHT WRIST - COMPLETE 3+ VIEW COMPARISON:  None. FINDINGS: Soft tissue swelling of the first through third digits without soft tissue gas or foreign body. No acute bony abnormality. Specifically, no fracture, subluxation, or dislocation of the hand or wrist. Normal bone mineralization. No evidence of underlying arthropathy or significant age advanced arthrosis. Mild negative ulnar variance. IMPRESSION: Soft tissue swelling of the first through third digits without soft tissue gas or foreign body. No acute osseous abnormality or traumatic malalignment of the hand or wrist. Electronically Signed   By: 03/13/2020 M.D.   On: 03/11/2020 21:42   DG Hand 2 View Right  Result Date: 03/11/2020 CLINICAL DATA:  Right hand injury, hand caught in machinery, middle finger, index finger and thumb are most in pain. Middle finger appearing swollen. EXAM: RIGHT HAND - 2 VIEW; RIGHT WRIST - COMPLETE 3+ VIEW COMPARISON:  None. FINDINGS: Soft tissue swelling of the first through third digits without soft tissue gas or foreign body. No acute bony abnormality. Specifically, no fracture, subluxation, or  dislocation of the hand or wrist. Normal bone mineralization. No evidence of underlying arthropathy or significant age advanced arthrosis. Mild negative ulnar variance. IMPRESSION: Soft tissue swelling of the first through third digits without soft tissue gas or foreign body. No acute osseous abnormality or traumatic malalignment of the hand or wrist. Electronically Signed   By: 03/13/2020 M.D.   On: 03/11/2020 21:42    Procedures Procedures   Medications Ordered in ED Medications  acetaminophen (TYLENOL) tablet 1,000 mg (1,000 mg Oral Given 03/11/20 2237)    ED Course  I have reviewed the triage vital signs and the nursing notes.  Pertinent labs & imaging results  that were available during my care of the patient were reviewed by me and considered in my medical decision making (see chart for details).    MDM Rules/Calculators/A&P                          Patient presenting today after getting her hand crushed in a machine at work.  She reports her thumb second and third fingers were in the machine for a brief moment but had no other injury.  She has most notable swelling of the third finger and limited range of motion at the PIP and DIP joint but feel that it is more related to pain and swelling.  X-rays negative for fracture and cap refill is normal.  Suspect soft tissue injury and contusion.  Patient placed in a finger splint instructed to use Tylenol and ibuprofen and elevate.  MDM Number of Diagnoses or Management Options   Amount and/or Complexity of Data Reviewed Tests in the radiology section of CPT: reviewed and ordered Independent visualization of images, tracings, or specimens: yes   Final Clinical Impression(s) / ED Diagnoses Final diagnoses:  Contusion of right middle finger without damage to nail, initial encounter    Rx / DC Orders ED Discharge Orders    None       Gwyneth Sprout, MD 03/11/20 2244

## 2020-03-11 NOTE — Progress Notes (Signed)
Orthopedic Tech Progress Note Patient Details:  Becky Pugh 11-30-1995 [id number]  Ortho Devices Type of Ortho Device: Finger splint Ortho Device/Splint Location: RUE Ortho Device/Splint Interventions: Ordered,Application   Post Interventions Patient Tolerated: Well Instructions Provided: Care of device   Jennye Moccasin 03/11/2020, 10:23 PM

## 2020-06-28 DIAGNOSIS — Z20822 Contact with and (suspected) exposure to covid-19: Secondary | ICD-10-CM | POA: Diagnosis not present

## 2022-03-29 ENCOUNTER — Telehealth: Payer: Self-pay

## 2022-03-29 NOTE — Telephone Encounter (Signed)
Mychart msg sent

## 2022-05-07 ENCOUNTER — Ambulatory Visit (INDEPENDENT_AMBULATORY_CARE_PROVIDER_SITE_OTHER): Payer: 59 | Admitting: Nurse Practitioner

## 2022-05-07 ENCOUNTER — Encounter: Payer: Self-pay | Admitting: Nurse Practitioner

## 2022-05-07 VITALS — BP 121/67 | HR 77 | Temp 97.7°F | Ht 64.0 in | Wt 159.6 lb

## 2022-05-07 DIAGNOSIS — Z130 Encounter for screening for diseases of the blood and blood-forming organs and certain disorders involving the immune mechanism: Secondary | ICD-10-CM

## 2022-05-07 DIAGNOSIS — N939 Abnormal uterine and vaginal bleeding, unspecified: Secondary | ICD-10-CM | POA: Diagnosis not present

## 2022-05-07 DIAGNOSIS — Z113 Encounter for screening for infections with a predominantly sexual mode of transmission: Secondary | ICD-10-CM

## 2022-05-07 DIAGNOSIS — Z13228 Encounter for screening for other metabolic disorders: Secondary | ICD-10-CM

## 2022-05-07 DIAGNOSIS — R87610 Atypical squamous cells of undetermined significance on cytologic smear of cervix (ASC-US): Secondary | ICD-10-CM | POA: Diagnosis not present

## 2022-05-07 DIAGNOSIS — Z30011 Encounter for initial prescription of contraceptive pills: Secondary | ICD-10-CM | POA: Diagnosis not present

## 2022-05-07 DIAGNOSIS — Z0000 Encounter for general adult medical examination without abnormal findings: Secondary | ICD-10-CM | POA: Diagnosis not present

## 2022-05-07 DIAGNOSIS — Z1321 Encounter for screening for nutritional disorder: Secondary | ICD-10-CM

## 2022-05-07 DIAGNOSIS — E559 Vitamin D deficiency, unspecified: Secondary | ICD-10-CM | POA: Diagnosis not present

## 2022-05-07 DIAGNOSIS — D500 Iron deficiency anemia secondary to blood loss (chronic): Secondary | ICD-10-CM

## 2022-05-07 DIAGNOSIS — H547 Unspecified visual loss: Secondary | ICD-10-CM | POA: Diagnosis not present

## 2022-05-07 DIAGNOSIS — Z1329 Encounter for screening for other suspected endocrine disorder: Secondary | ICD-10-CM

## 2022-05-07 LAB — POCT URINE PREGNANCY: Preg Test, Ur: NEGATIVE

## 2022-05-07 MED ORDER — NORGESTIMATE-ETH ESTRADIOL 0.25-35 MG-MCG PO TABS
ORAL_TABLET | ORAL | 11 refills | Status: AC
Start: 2022-05-07 — End: ?

## 2022-05-07 NOTE — Patient Instructions (Addendum)
Day 1 start: Start on first day of menstrual cycle. Additional method of contraception not required   Quick start: Start on the day the patient receives the prescription. If initiated >5 days after the onset of menses, use an additional method of contraception (nonhormonal) until 7 days of consecutive administration  Sunday start: Start on first Sunday after onset of menstruation; if the menstrual period starts on Sunday, take first tablet on same day. If initiated >5 days after the onset of menses, use an additional method of contraception (nonhormonal) until 7 days of consecutive administration        Encounter for initial prescription of contraceptive pills  - norgestimate-ethinyl estradiol (ORTHO-CYCLEN) 0.25-35 MG-MCG tablet; Take as instructed on the packaging  Dispense: 30 tablet; Refill: 11

## 2022-05-07 NOTE — Assessment & Plan Note (Signed)
Currently not on iron supplement Check CBC Iron panel

## 2022-05-07 NOTE — Assessment & Plan Note (Signed)
Annual exam as documented.  Counseling done include healthy lifestyle involving committing to 150 minutes of exercise per week, heart healthy diet, and attaining healthy weight. The importance of adequate sleep also discussed.  Regular use of seat belt and home safety were also discussed .  Immunization and cancer screening  needs are specifically addressed at this visit.  She declined Pap exam and breast exam today. self breast exam encouraged.check routine labs

## 2022-05-07 NOTE — Progress Notes (Signed)
Complete physical exam  Patient: Becky Pugh   DOB: 1995/02/10   26 y.o. Female  MRN: [id number]  Subjective:    Chief Complaint  Patient presents with   Annual Exam    Birth control, and menstrual cycle issues.    Becky Pugh is a 27 y.o. female with iron deficiency anemia, latent tuberculosis infection, vitamin D deficiency, abnormal vaginal bleeding who presents today for a complete physical exam and establish care for her chronic medical conditions.  No recent PCP.  She was being followed by OB/GYN and nephrology in the past.  She reports consuming a general diet.  Goes to the gym sometimes She generally feels well.  have additional problems to discuss today.   Abnormal vaginal bleeding.  Patient reports heavy menstrual cycles with bleeding lasting longer than 5 days.  Stated that birth control had helped her abnormal vaginal bleeding when she was taking the medication.  She also needs birth control for contraception. she denies abdominal cramping.  She does not smoke does not have history of blood clots.  She had ASCUS and positive HPV in 2021 due for repeat Pap smear but patient declined Pap smear today.  She plans traveling to [place] next month, need for malaria prophylaxis discussed.  Patient encouraged to call the office few days before her travelling for medication prescription.    She would also like STD testing has not had sex in 2 years.  She denies fever, chills, malaise ,abnormal vaginal discharge,.   Most recent fall risk assessment:    05/07/2022    1:58 PM  Fall Risk   Falls in the past year? 0  Number falls in past yr: 0  Injury with Fall? 0  Risk for fall due to : No Fall Risks  Follow up Falls evaluation completed     Most recent depression screenings:    05/07/2022    1:58 PM 05/19/2019   11:47 AM  PHQ 2/9 Scores  PHQ - 2 Score 0 0  PHQ- 9 Score 2 3        Patient Care Team: Donell Beers, FNP as PCP - General (Nurse Practitioner)    Outpatient Medications Prior to Visit  Medication Sig   naproxen (NAPROSYN) 500 MG tablet Take 1 tablet (500 mg total) by mouth 2 (two) times daily. (Patient not taking: Reported on 04/03/2019)   [DISCONTINUED] cephALEXin (KEFLEX) 500 MG capsule Take 1 capsule (500 mg total) by mouth 2 (two) times daily. (Patient not taking: Reported on 05/07/2022)   [DISCONTINUED] norgestimate-ethinyl estradiol (ORTHO-CYCLEN) 0.25-35 MG-MCG tablet Take 1 tablet by mouth daily. (Patient not taking: Reported on 03/11/2020)   [DISCONTINUED] triamcinolone (KENALOG) 0.1 % Apply 1 application topically 2 (two) times daily. (Patient not taking: Reported on 03/11/2020)   No facility-administered medications prior to visit.    Review of Systems  Constitutional:  Positive for malaise/fatigue. Negative for chills, diaphoresis, fever and weight loss.  HENT: Negative.    Eyes:  Negative for pain, discharge and redness.  Respiratory: Negative.    Cardiovascular: Negative.   Gastrointestinal: Negative.   Genitourinary: Negative.   Musculoskeletal: Negative.   Skin: Negative.   Neurological: Negative.   Endo/Heme/Allergies: Negative.   Psychiatric/Behavioral: Negative.            Objective:     BP 121/67   Pulse 77   Temp 97.7 F (36.5 C)   Ht  (1.626 m)   Wt 159 lb 9.6 oz (72.4 kg)   LMP 04/02/2022 (  Approximate)   SpO2 100%   BMI 27.40 kg/m    Physical Exam Constitutional:      General: She is not in acute distress.    Appearance: Normal appearance. She is not ill-appearing, toxic-appearing or diaphoretic.  HENT:     Right Ear: Tympanic membrane, ear canal and external ear normal. There is no impacted cerumen.     Left Ear: Tympanic membrane, ear canal and external ear normal. There is no impacted cerumen.     Nose: Congestion present. No rhinorrhea.     Comments: Denies sneezing, stuffy nose shortness of breath    Mouth/Throat:     Mouth: Mucous membranes are moist.     Pharynx:  Oropharynx is clear. No oropharyngeal exudate or posterior oropharyngeal erythema.  Eyes:     General: No scleral icterus.       Right eye: No discharge.        Left eye: No discharge.     Extraocular Movements: Extraocular movements intact.  Neck:     Vascular: No carotid bruit.  Cardiovascular:     Rate and Rhythm: Normal rate and regular rhythm.     Pulses: Normal pulses.     Heart sounds: No murmur heard.    No friction rub. No gallop.  Pulmonary:     Effort: Pulmonary effort is normal. No respiratory distress.     Breath sounds: Normal breath sounds. No stridor. No wheezing, rhonchi or rales.  Chest:     Chest wall: No tenderness.  Abdominal:     General: There is no distension.     Palpations: There is no mass.     Tenderness: There is no abdominal tenderness. There is no right CVA tenderness, left CVA tenderness, guarding or rebound.     Hernia: No hernia is present.  Musculoskeletal:        General: No swelling, tenderness, deformity or signs of injury.     Cervical back: Normal range of motion and neck supple. No rigidity or tenderness.     Right lower leg: No edema.     Left lower leg: No edema.  Lymphadenopathy:     Cervical: No cervical adenopathy.  Skin:    General: Skin is warm and dry.     Capillary Refill: Capillary refill takes less than 2 seconds.     Coloration: Skin is not jaundiced or pale.     Findings: No bruising, erythema, lesion or rash.  Neurological:     Mental Status: She is alert and oriented to person, place, and time.     Cranial Nerves: No cranial nerve deficit.     Sensory: No sensory deficit.     Motor: No weakness.     Coordination: Coordination normal.     Gait: Gait normal.     Deep Tendon Reflexes: Reflexes normal.  Psychiatric:        Mood and Affect: Mood normal.        Behavior: Behavior normal.        Thought Content: Thought content normal.        Judgment: Judgment normal.      Results for orders placed or performed in  visit on 05/07/22  POCT urine pregnancy  Result Value Ref Range   Preg Test, Ur Negative Negative       Assessment & Plan:    Routine Health Maintenance and Physical Exam  Immunization History  Administered Date(s) Administered   HPV 9-valent 03/16/2014, 09/01/2014   HPV Quadrivalent 09/24/2013  Hepatitis A 09/24/2013   Hepatitis A, Ped/Adol-2 Dose 04/06/2014   Hepatitis B 09/24/2013   Hepatitis B, PED/ADOLESCENT 03/16/2014, 05/26/2014   IPV 09/24/2013, 03/16/2014, 09/01/2014   Influenza,Quad,Nasal, Live 03/16/2014   Influenza,inj,Quad PF,6+ Mos 10/20/2014   MMR 09/24/2013   MMRV 03/16/2014   Meningococcal Conjugate 09/24/2013   Td 09/24/2013, 03/16/2014   Tdap 09/24/2013   Varicella 09/24/2013    Health Maintenance  Topic Date Due   COVID-19 Vaccine (1) Never done   PAP-Cervical Cytology Screening  05/19/2022   PAP SMEAR-Modifier  05/19/2022   INFLUENZA VACCINE  08/23/2022   DTaP/Tdap/Td (3 - Tdap) 03/16/2024   HPV VACCINES  Completed   Hepatitis C Screening  Completed   HIV Screening  Completed    Discussed health benefits of physical activity, and encouraged her to engage in regular exercise appropriate for her age and condition.  Problem List Items Addressed This Visit       Genitourinary   Pap smear abnormality of cervix with ASCUS favoring benign    Patient refused repeat Pap today        Other   Vitamin D deficiency    Last vitamin D Lab Results  Component Value Date   VD25OH 25 (L) 09/01/2014  Check vitamin D levels      Absolute anemia    Currently not on iron supplement Check CBC Iron panel      Relevant Orders   CBC with Differential   Iron, TIBC and Ferritin Panel   Annual physical exam - Primary    Annual exam as documented.  Counseling done include healthy lifestyle involving committing to 150 minutes of exercise per week, heart healthy diet, and attaining healthy weight. The importance of adequate sleep also discussed.  Regular  use of seat belt and home safety were also discussed .  Immunization and cancer screening  needs are specifically addressed at this visit.  She declined Pap exam and breast exam today. self breast exam encouraged.check routine labs      Abnormal vaginal bleeding    She refuses referral to OB/GYN - norgestimate-ethinyl estradiol (ORTHO-CYCLEN) 0.25-35 MG-MCG tablet; Take as instructed on the packaging  Dispense: 30 tablet; Refill: 11 Check CBC       Relevant Medications   norgestimate-ethinyl estradiol (ORTHO-CYCLEN) 0.25-35 MG-MCG tablet   Other Visit Diagnoses     Encounter for initial prescription of contraceptive pills       Relevant Medications   norgestimate-ethinyl estradiol (ORTHO-CYCLEN) 0.25-35 MG-MCG tablet   Other Relevant Orders   POCT urine pregnancy (Completed)   Impaired vision       Relevant Orders   Ambulatory referral to Ophthalmology   Screening for endocrine, nutritional, metabolic and immunity disorder       Relevant Orders   CMP14+EGFR   Lipid Panel   Hemoglobin A1c   TSH   Vitamin D, 25-hydroxy   Screening examination for STD (sexually transmitted disease)       Relevant Orders   HIV antibody (with reflex)   Hepatitis C antibody   RPR      Return in about 6 months (around 11/06/2022).     Donell Beers, FNP

## 2022-05-07 NOTE — Assessment & Plan Note (Signed)
She refuses referral to OB/GYN - norgestimate-ethinyl estradiol (ORTHO-CYCLEN) 0.25-35 MG-MCG tablet; Take as instructed on the packaging  Dispense: 30 tablet; Refill: 11 Check CBC

## 2022-05-07 NOTE — Assessment & Plan Note (Addendum)
Patient refused repeat Pap today

## 2022-05-07 NOTE — Assessment & Plan Note (Signed)
Last vitamin D Lab Results  Component Value Date   VD25OH 25 (L) 09/01/2014  Check vitamin D levels

## 2022-05-08 ENCOUNTER — Other Ambulatory Visit: Payer: Self-pay | Admitting: Nurse Practitioner

## 2022-05-08 ENCOUNTER — Telehealth: Payer: Self-pay

## 2022-05-08 DIAGNOSIS — N939 Abnormal uterine and vaginal bleeding, unspecified: Secondary | ICD-10-CM

## 2022-05-08 DIAGNOSIS — D509 Iron deficiency anemia, unspecified: Secondary | ICD-10-CM

## 2022-05-08 LAB — CMP14+EGFR
ALT: 12 IU/L (ref 0–32)
AST: 15 IU/L (ref 0–40)
Albumin/Globulin Ratio: 1.5 (ref 1.2–2.2)
Albumin: 4.3 g/dL (ref 4.0–5.0)
Alkaline Phosphatase: 83 IU/L (ref 44–121)
BUN/Creatinine Ratio: 15 (ref 9–23)
BUN: 12 mg/dL (ref 6–20)
Bilirubin Total: 0.2 mg/dL (ref 0.0–1.2)
CO2: 21 mmol/L (ref 20–29)
Calcium: 9.2 mg/dL (ref 8.7–10.2)
Chloride: 104 mmol/L (ref 96–106)
Creatinine, Ser: 0.81 mg/dL (ref 0.57–1.00)
Globulin, Total: 2.9 g/dL (ref 1.5–4.5)
Glucose: 81 mg/dL (ref 70–99)
Potassium: 3.9 mmol/L (ref 3.5–5.2)
Sodium: 137 mmol/L (ref 134–144)
Total Protein: 7.2 g/dL (ref 6.0–8.5)
eGFR: 103 mL/min/{1.73_m2} (ref >59)

## 2022-05-08 LAB — IRON,TIBC AND FERRITIN PANEL
Ferritin: 2 ng/mL — ABNORMAL LOW (ref 15–150)
Iron Saturation: 2 % — CL (ref 15–55)
Iron: 9 ug/dL — CL (ref 27–159)
Total Iron Binding Capacity: 418 ug/dL (ref 250–450)
UIBC: 409 ug/dL (ref 131–425)

## 2022-05-08 LAB — CBC WITH DIFFERENTIAL/PLATELET
Basophils Absolute: 0 10*3/uL (ref 0.0–0.2)
Basos: 1 %
EOS (ABSOLUTE): 0.2 10*3/uL (ref 0.0–0.4)
Eos: 4 %
Hematocrit: 21 % — ABNORMAL LOW (ref 34.0–46.6)
Hemoglobin: 5.8 g/dL — CL (ref 11.1–15.9)
Immature Grans (Abs): 0 10*3/uL (ref 0.0–0.1)
Immature Granulocytes: 0 %
Lymphocytes Absolute: 1.4 10*3/uL (ref 0.7–3.1)
Lymphs: 34 %
MCH: 20.4 pg — ABNORMAL LOW (ref 26.6–33.0)
MCHC: 27.6 g/dL — ABNORMAL LOW (ref 31.5–35.7)
MCV: 74 fL — ABNORMAL LOW (ref 79–97)
Monocytes Absolute: 0.6 10*3/uL (ref 0.1–0.9)
Monocytes: 14 %
Neutrophils Absolute: 2 10*3/uL (ref 1.4–7.0)
Neutrophils: 47 %
Platelets: 401 10*3/uL (ref 150–450)
RBC: 2.85 x10E6/uL — ABNORMAL LOW (ref 3.77–5.28)
RDW: 20 % — ABNORMAL HIGH (ref 11.7–15.4)
WBC: 4.2 10*3/uL (ref 3.4–10.8)

## 2022-05-08 LAB — RPR: RPR Ser Ql: NONREACTIVE

## 2022-05-08 LAB — HEMOGLOBIN A1C
Est. average glucose Bld gHb Est-mCnc: 120 mg/dL
Hgb A1c MFr Bld: 5.8 % — ABNORMAL HIGH (ref 4.8–5.6)

## 2022-05-08 LAB — HEPATITIS C ANTIBODY: Hep C Virus Ab: NONREACTIVE

## 2022-05-08 LAB — VITAMIN D 25 HYDROXY (VIT D DEFICIENCY, FRACTURES): Vit D, 25-Hydroxy: 15.3 ng/mL — ABNORMAL LOW (ref 30.0–100.0)

## 2022-05-08 LAB — LIPID PANEL
Chol/HDL Ratio: 2.4 ratio (ref 0.0–4.4)
Cholesterol, Total: 175 mg/dL (ref 100–199)
HDL: 72 mg/dL (ref >39)
LDL Chol Calc (NIH): 93 mg/dL (ref 0–99)
Triglycerides: 52 mg/dL (ref 0–149)
VLDL Cholesterol Cal: 10 mg/dL (ref 5–40)

## 2022-05-08 LAB — HIV ANTIBODY (ROUTINE TESTING W REFLEX): HIV Screen 4th Generation wRfx: NONREACTIVE

## 2022-05-08 LAB — TSH: TSH: 1.46 u[IU]/mL (ref 0.450–4.500)

## 2022-05-08 MED ORDER — FERROUS SULFATE 325 (65 FE) MG PO TABS: 325.0000 mg | ORAL_TABLET | Freq: Two times a day (BID) | ORAL | 3 refills | Status: AC

## 2022-05-08 NOTE — Telephone Encounter (Signed)
Pt hemoglobin came back 5.8. per [facility]. Please advise [facility]

## 2022-05-08 NOTE — Progress Notes (Signed)
Your iron level is very low, please come to the office tomorrow for a recheck of your blood count.  You might need blood transfusion, if blood transfusion is needed we can do that at the office here.  In the meantime please start taking ferrous sulfate 325 mg twice daily. .  I will also refer you to hematology and OBGYN.  Please schedule an appointment for lab draw into the hospital tomorrow.

## 2022-05-09 NOTE — Telephone Encounter (Signed)
Called twice to pt mother due to her being off. Lvm messages and hope she will return call. KH

## 2022-05-14 NOTE — Progress Notes (Signed)
Unable to contact pt, letter with labs mail it to pt home address. Gh.

## 2022-05-25 ENCOUNTER — Ambulatory Visit: Payer: BC Managed Care – PPO | Admitting: Nurse Practitioner

## 2022-06-14 NOTE — Progress Notes (Unsigned)
[facility] Cancer Initial Visit:  Patient Care Team: Donell Beers, FNP as PCP - General (Nurse Practitioner)  CHIEF COMPLAINTS/PURPOSE OF CONSULTATION: HISTORY OF PRESENTING ILLNESS:    Becky Pugh 27 y.o. female is here because of anemia   Jun 15 2022:  [facility] Hematology Consult.   Patient does not recall being told that she is anemic.  Patient is G0 P0.  Menopause not reached.  Menses are irregular lasting 6 to 30 days and heavy.  Has not seen a gynecologist.  Patient not aware if she has uterine fibroids/uterine abnormalities.  Has not taken oral iron/ received IV iron/ required PRBC's in the past.  Has a normal diet Has never undergone surgery.  No hematochezia, melena, hemoptysis, hematuria.  No history of intra-articular or soft tissue bleeding.  No history of abnormal bleeding in family members.  Patient has symptoms of fatigue, pallor,  DOE, decreased performance status.  Patient has pica to ice but not to starch/dirt.  Was just placed on orthocyclin by PCP.    Social:  Single.  Works in a [employer].  Tobacco none.  EtOH none.  Originally from [place]  [facility] Mother alive 64 none that patient is aware of Father died 81 kidney problems Brothers x 6 well Sisters x 2 well   Review of Systems - Oncology  MEDICAL HISTORY: No past medical history on file.  SURGICAL HISTORY: No past surgical history on file.  SOCIAL HISTORY: Social History   Socioeconomic History  . Marital status: Single    Spouse name: Not on file  . Number of children: Not on file  . Years of education: Not on file  . Highest education level: Not on file  Occupational History  . Not on file  Tobacco Use  . Smoking status: Never  . Smokeless tobacco: Never  Vaping Use  . Vaping Use: Never used  Substance and Sexual Activity  . Alcohol use: Never    Alcohol/week: 0.0 standard drinks of alcohol  . Drug use: Never  . Sexual activity: Not Currently  Other Topics Concern   . Not on file  Social History Narrative   Lives with her parents    Social Determinants of Health   Financial Resource Strain: Not on file  Food Insecurity: Not on file  Transportation Needs: Not on file  Physical Activity: Not on file  Stress: Not on file  Social Connections: Not on file  Intimate Partner Violence: Not on file    FAMILY HISTORY Family History  Problem Relation Age of Onset  . Healthy Mother   . Kidney disease Father   . Heart disease Father   . Breast cancer Neg Hx   . Cervical cancer Neg Hx     ALLERGIES:  has No Known Allergies.  MEDICATIONS:  Current Outpatient Medications  Medication Sig Dispense Refill  . ferrous sulfate 325 (65 FE) MG tablet Take 1 tablet (325 mg total) by mouth 2 (two) times daily with a meal. 60 tablet 3  . naproxen (NAPROSYN) 500 MG tablet Take 1 tablet (500 mg total) by mouth 2 (two) times daily. (Patient not taking: Reported on 04/03/2019) 30 tablet 0  . norgestimate-ethinyl estradiol (ORTHO-CYCLEN) 0.25-35 MG-MCG tablet Take as instructed on the packaging 30 tablet 11   No current facility-administered medications for this visit.    PHYSICAL EXAMINATION:  ECOG PERFORMANCE STATUS: {CHL ONC ECOG PS:[phone]}   There were no vitals filed for this visit.  There were no vitals filed  for this visit.   Physical Exam Vitals and nursing note reviewed.  Constitutional:      Appearance: Normal appearance. She is not toxic-appearing or diaphoretic.     Comments: Here alone  HENT:     Head: Normocephalic and atraumatic.     Right Ear: External ear normal.     Left Ear: External ear normal.     Nose: Nose normal. No congestion or rhinorrhea.  Eyes:     General: No scleral icterus.    Extraocular Movements: Extraocular movements intact.     Conjunctiva/sclera: Conjunctivae normal.     Pupils: Pupils are equal, round, and reactive to light.  Cardiovascular:     Rate and Rhythm: Normal rate.     Heart sounds: No murmur  heard.    No friction rub. No gallop.  Abdominal:     General: Bowel sounds are normal.     Palpations: Abdomen is soft.  Musculoskeletal:        General: No swelling, tenderness or deformity.     Cervical back: Normal range of motion and neck supple. No rigidity or tenderness.  Lymphadenopathy:     Head:     Right side of head: No submental, submandibular, tonsillar, preauricular, posterior auricular or occipital adenopathy.     Left side of head: No submental, submandibular, tonsillar, preauricular, posterior auricular or occipital adenopathy.     Cervical: No cervical adenopathy.     Right cervical: No superficial, deep or posterior cervical adenopathy.    Left cervical: No superficial, deep or posterior cervical adenopathy.     Upper Body:     Right upper body: No supraclavicular, axillary, pectoral or epitrochlear adenopathy.     Left upper body: No supraclavicular, axillary, pectoral or epitrochlear adenopathy.  Skin:    General: Skin is warm.     Coloration: Skin is not jaundiced.  Neurological:     General: No focal deficit present.     Mental Status: She is alert and oriented to person, place, and time. Mental status is at baseline.     Cranial Nerves: No cranial nerve deficit.  Psychiatric:        Mood and Affect: Mood normal.        Behavior: Behavior normal.        Thought Content: Thought content normal.        Judgment: Judgment normal.    LABORATORY DATA: I have personally reviewed the data as listed:  No visits with results within 1 Month(s) from this visit.  Latest known visit with results is:  Office Visit on 05/07/2022  Component Date Value Ref Range Status  . WBC 05/07/2022 4.2  3.4 - 10.8 x10E3/uL Final  . RBC 05/07/2022 2.85 (L)  3.77 - 5.28 x10E6/uL Final  . Hemoglobin 05/07/2022 5.8 (LL)  11.1 - 15.9 g/dL Final   **CBC Results Repeated**  . Hematocrit 05/07/2022 21.0 (L)  34.0 - 46.6 % Final  . MCV 05/07/2022 74 (L)  79 - 97 fL Final  . MCH  05/07/2022 20.4 (L)  26.6 - 33.0 pg Final  . MCHC 05/07/2022 27.6 (L)  31.5 - 35.7 g/dL Final  . RDW 16/10/9602 20.0 (H)  11.7 - 15.4 % Final  . Platelets 05/07/2022 401  150 - 450 x10E3/uL Final  . Neutrophils 05/07/2022 47  Not Estab. % Final  . Lymphs 05/07/2022 34  Not Estab. % Final  . Monocytes 05/07/2022 14  Not Estab. % Final  . Eos 05/07/2022 4  Not  Estab. % Final  . Basos 05/07/2022 1  Not Estab. % Final  . Neutrophils Absolute 05/07/2022 2.0  1.4 - 7.0 x10E3/uL Final  . Lymphocytes Absolute 05/07/2022 1.4  0.7 - 3.1 x10E3/uL Final  . Monocytes Absolute 05/07/2022 0.6  0.1 - 0.9 x10E3/uL Final  . EOS (ABSOLUTE) 05/07/2022 0.2  0.0 - 0.4 x10E3/uL Final  . Basophils Absolute 05/07/2022 0.0  0.0 - 0.2 x10E3/uL Final  . Immature Granulocytes 05/07/2022 0  Not Estab. % Final  . Immature Grans (Abs) 05/07/2022 0.0  0.0 - 0.1 x10E3/uL Final  . Glucose 05/07/2022 81  70 - 99 mg/dL Final  . BUN 40/98/1191 12  6 - 20 mg/dL Final  . Creatinine, Ser 05/07/2022 0.81  0.57 - 1.00 mg/dL Final  . eGFR 47/82/9562 103  >59 mL/min/1.73 Final  . BUN/Creatinine Ratio 05/07/2022 15  9 - 23 Final  . Sodium 05/07/2022 137  134 - 144 mmol/L Final  . Potassium 05/07/2022 3.9  3.5 - 5.2 mmol/L Final  . Chloride 05/07/2022 104  96 - 106 mmol/L Final  . CO2 05/07/2022 21  20 - 29 mmol/L Final  . Calcium 05/07/2022 9.2  8.7 - 10.2 mg/dL Final  . Total Protein 05/07/2022 7.2  6.0 - 8.5 g/dL Final  . Albumin 13/08/6576 4.3  4.0 - 5.0 g/dL Final  . Globulin, Total 05/07/2022 2.9  1.5 - 4.5 g/dL Final  . Albumin/Globulin Ratio 05/07/2022 1.5  1.2 - 2.2 Final  . Bilirubin Total 05/07/2022 0.2  0.0 - 1.2 mg/dL Final  . Alkaline Phosphatase 05/07/2022 83  44 - 121 IU/L Final  . AST 05/07/2022 15  0 - 40 IU/L Final  . ALT 05/07/2022 12  0 - 32 IU/L Final  . Total Iron Binding Capacity 05/07/2022 418  250 - 450 ug/dL Final  . UIBC 46/96/2952 409  131 - 425 ug/dL Final  . Iron 84/13/2440 9 (LL)  27 - 159 ug/dL  Final  . Iron Saturation 05/07/2022 2 (LL)  15 - 55 % Final  . Ferritin 05/07/2022 2 (L)  15 - 150 ng/mL Final  . Cholesterol, Total 05/07/2022 175  100 - 199 mg/dL Final  . Triglycerides 05/07/2022 52  0 - 149 mg/dL Final  . HDL 11/18/2534 72  >39 mg/dL Final  . VLDL Cholesterol Cal 05/07/2022 10  5 - 40 mg/dL Final  . LDL Chol Calc (NIH) 05/07/2022 93  0 - 99 mg/dL Final  . Chol/HDL Ratio 05/07/2022 2.4  0.0 - 4.4 ratio Final   Comment:                                   T. Chol/HDL Ratio                                             Men  Women                               1/2 Avg.Risk  3.4    3.3                                   Avg.Risk  5.0    4.4  2X Avg.Risk  9.6    7.1                                3X Avg.Risk 23.4   11.0   . Hgb A1c MFr Bld 05/07/2022 5.8 (H)  4.8 - 5.6 % Final   Comment:          Prediabetes: 5.7 - 6.4          Diabetes: >6.4          Glycemic control for adults with diabetes: <7.0   . Est. average glucose Bld gHb Est-m* 05/07/2022 120  mg/dL Final  . TSH 16/10/9602 1.460  0.450 - 4.500 uIU/mL Final  . Vit D, 25-Hydroxy 05/07/2022 15.3 (L)  30.0 - 100.0 ng/mL Final   Comment: Vitamin D deficiency has been defined by the Institute of Medicine and an Endocrine Society practice guideline as a level of serum 25-OH vitamin D less than 20 ng/mL (1,2). The Endocrine Society went on to further define vitamin D insufficiency as a level between 21 and 29 ng/mL (2). 1. IOM (Institute of Medicine). 2010. Dietary reference    intakes for calcium and D. [place]: The    [employer]. 2. Holick MF, Binkley McCurtain, Bischoff-Ferrari HA, et al.    Evaluation, treatment, and prevention of vitamin D    deficiency: an Endocrine Society clinical practice    guideline. JCEM. 2011 Jul; 96(7):1911-30.   Marland Kitchen HIV Screen 4th Generation wRfx 05/07/2022 Non Reactive  Non Reactive Final   Comment: HIV-1/HIV-2 antibodies and HIV-1 p24 antigen  were NOT detected. There is no laboratory evidence of HIV infection. HIV Negative   . Hep C Virus Ab 05/07/2022 Non Reactive  Non Reactive Final   Comment: HCV antibody alone does not differentiate between previously resolved infection and active infection. Equivocal and Reactive HCV antibody results should be followed up with an HCV RNA test to support the diagnosis of active HCV infection.   . RPR Ser Ql 05/07/2022 Non Reactive  Non Reactive Final  . Preg Test, Ur 05/07/2022 Negative  Negative Final    RADIOGRAPHIC STUDIES: I have personally reviewed the radiological images as listed and agree with the findings in the report  No results found.  ASSESSMENT/PLAN  Patient is a year old female with symptomatic microcytic anemia presumed to be secondary to dysfunctional uterine bleeding  Anemia:  Most likely etiology is iron deficiency anemia owing to dysfunctional uterine bleeding/ exacerbated by high demand owing to prior pregnancies.  Will obtain CBC with diff, CMP, Ferritin, B12, folate, retic count,  DAT, Haptoglobin Dysfunctional uterine bleeding:  This is characterized by menses that are prolonged, irregular as well as by heavy bleeding with passage of clots.  Will evaluate for possible bleeding disorder with PT, PTT, Fibrinogen, von Willebrand screen.  Refer to gynecology for evaluation and management  Therapeutics:  Since patient has symptomatic anemia with Hgb < 10  and has not tolerated/been compliant with oral iron will arrange for IV iron replacement.   Given the severe symptoms we prefer to replete iron stores in one or two visits rather than over the course of several months.  In addition ongoing blood loss exceeds the capacity of oral iron to meet needs. A discussion regarding risks was had with the patient.  IV iron has the potential to cause allergic reactions, including potentially life-threatening anaphylaxis.   IV iron may be associated with non-allergic  infusion  reactions including self-limiting urticaria, palpitations, dizziness, and neck and back spasm; generally, these occur in <1 percent of individuals and do not progress to more serious reactions. The non-allergic reaction consisting of flushing of the face and myalgias of the chest and back.   After discussion of the risks and benefits of IV iron therapy patient has elected to proceed with parenteral iron therapy.      Cancer Staging  No matching staging information was found for the patient.   No problem-specific Assessment & Plan notes found for this encounter.    No orders of the defined types were placed in this encounter.     minutes was spent in patient care.  This included time spent preparing to see the patient (e.g., review of tests), obtaining and/or reviewing separately obtained history, counseling and educating the patient/family/caregiver, ordering medications, tests, or procedures; documenting clinical information in the electronic or other health record, independently interpreting results and communicating results to the patient/family/caregiver as well as coordination of care.       All questions were answered. The patient knows to call the clinic with any problems, questions or concerns.  This note was electronically signed.    Loni Muse, MD  06/14/2022 9:31 AM

## 2022-06-15 ENCOUNTER — Other Ambulatory Visit: Payer: Self-pay

## 2022-06-15 ENCOUNTER — Inpatient Hospital Stay: Payer: 59 | Attending: Oncology | Admitting: Oncology

## 2022-06-15 ENCOUNTER — Inpatient Hospital Stay: Payer: 59

## 2022-06-15 VITALS — BP 128/69 | HR 69 | Temp 98.6°F | Resp 17 | Ht 64.0 in | Wt 161.2 lb

## 2022-06-15 DIAGNOSIS — D539 Nutritional anemia, unspecified: Secondary | ICD-10-CM

## 2022-06-15 DIAGNOSIS — N938 Other specified abnormal uterine and vaginal bleeding: Secondary | ICD-10-CM

## 2022-06-15 DIAGNOSIS — N939 Abnormal uterine and vaginal bleeding, unspecified: Secondary | ICD-10-CM

## 2022-06-15 DIAGNOSIS — D509 Iron deficiency anemia, unspecified: Secondary | ICD-10-CM | POA: Insufficient documentation

## 2022-06-15 LAB — PROTIME-INR
INR: 1.1 (ref 0.8–1.2)
Prothrombin Time: 13.9 seconds (ref 11.4–15.2)

## 2022-06-15 LAB — DIRECT ANTIGLOBULIN TEST (NOT AT ARMC)
DAT, IgG: NEGATIVE
DAT, complement: NEGATIVE

## 2022-06-15 LAB — FIBRINOGEN: Fibrinogen: 299 mg/dL (ref 210–475)

## 2022-06-15 LAB — CBC WITH DIFFERENTIAL (CANCER CENTER ONLY)
Abs Immature Granulocytes: 0 10*3/uL (ref 0.00–0.07)
Basophils Absolute: 0 10*3/uL (ref 0.0–0.1)
Basophils Relative: 1 %
Eosinophils Absolute: 0.2 10*3/uL (ref 0.0–0.5)
Eosinophils Relative: 6 %
HCT: 20 % — ABNORMAL LOW (ref 36.0–46.0)
Hemoglobin: 5.5 g/dL — CL (ref 12.0–15.0)
Immature Granulocytes: 0 %
Lymphocytes Relative: 42 %
Lymphs Abs: 1.7 10*3/uL (ref 0.7–4.0)
MCH: 18.3 pg — ABNORMAL LOW (ref 26.0–34.0)
MCHC: 27.5 g/dL — ABNORMAL LOW (ref 30.0–36.0)
MCV: 66.4 fL — ABNORMAL LOW (ref 80.0–100.0)
Monocytes Absolute: 0.6 10*3/uL (ref 0.1–1.0)
Monocytes Relative: 13 %
Neutro Abs: 1.6 10*3/uL — ABNORMAL LOW (ref 1.7–7.7)
Neutrophils Relative %: 38 %
Platelet Count: 172 10*3/uL (ref 150–400)
RBC: 3.01 MIL/uL — ABNORMAL LOW (ref 3.87–5.11)
RDW: 21.5 % — ABNORMAL HIGH (ref 11.5–15.5)
WBC Count: 4.2 10*3/uL (ref 4.0–10.5)
nRBC: 0 % (ref 0.0–0.2)

## 2022-06-15 LAB — RETICULOCYTES
Immature Retic Fract: 10.3 % (ref 2.3–15.9)
RBC.: 3 MIL/uL — ABNORMAL LOW (ref 3.87–5.11)
Retic Count, Absolute: 26.4 10*3/uL (ref 19.0–186.0)
Retic Ct Pct: 0.9 % (ref 0.4–3.1)

## 2022-06-15 LAB — APTT: aPTT: 25 seconds (ref 24–36)

## 2022-06-15 LAB — FOLATE: Folate: 6.4 ng/mL (ref >5.9)

## 2022-06-17 LAB — HAPTOGLOBIN: Haptoglobin: 106 mg/dL (ref 33–278)

## 2022-06-19 ENCOUNTER — Encounter: Payer: Self-pay | Admitting: Oncology

## 2022-06-19 ENCOUNTER — Telehealth: Payer: Self-pay | Admitting: Oncology

## 2022-06-19 NOTE — Telephone Encounter (Signed)
Spoke with patient to get her scheduled per 5/24 los. Patients says she will be going out of the country for 5 months and will schedule when she gets back.

## 2022-06-20 ENCOUNTER — Encounter: Payer: Self-pay | Admitting: Oncology

## 2022-06-20 ENCOUNTER — Ambulatory Visit (HOSPITAL_COMMUNITY)
Admission: RE | Admit: 2022-06-20 | Discharge: 2022-06-20 | Disposition: A | Discharge status: Home / Self Care | Payer: 59 | Source: Ambulatory Visit | Attending: Oncology | Admitting: Oncology

## 2022-06-20 DIAGNOSIS — N938 Other specified abnormal uterine and vaginal bleeding: Secondary | ICD-10-CM | POA: Insufficient documentation

## 2022-06-20 DIAGNOSIS — N939 Abnormal uterine and vaginal bleeding, unspecified: Secondary | ICD-10-CM | POA: Diagnosis not present

## 2022-06-20 DIAGNOSIS — N83291 Other ovarian cyst, right side: Secondary | ICD-10-CM | POA: Diagnosis not present

## 2022-06-20 LAB — HGB FRACTIONATION CASCADE
Hgb A2: 1.8 % (ref 1.8–3.2)
Hgb A: 98.2 % (ref 96.4–98.8)
Hgb F: 0 % (ref 0.0–2.0)
Hgb S: 0 %

## 2022-06-20 NOTE — Addendum Note (Signed)
Addended by: Leatha Gilding on: 06/20/2022 02:10 PM   Modules accepted: Orders

## 2022-06-21 LAB — COPPER, SERUM: Copper: 156 ug/dL (ref 80–158)

## 2022-06-21 LAB — ZINC: Zinc: 72 ug/dL (ref 44–115)

## 2022-08-29 ENCOUNTER — Telehealth: Payer: Self-pay

## 2022-08-29 NOTE — Telephone Encounter (Signed)
Called back To the eye center Becky Pugh) [phone]. She was advised that we do not have  another number to reach pt to schedule however we would be happy to mail pta letter advising her to call and make her appt. KH

## 2022-10-30 ENCOUNTER — Encounter: Payer: Self-pay | Admitting: Oncology

## 2022-11-06 ENCOUNTER — Ambulatory Visit: Payer: Self-pay | Admitting: Nurse Practitioner
